# Patient Record
Sex: Female | Born: 1965 | Race: White | Hispanic: No | Marital: Married | Smoking: Current every day smoker
Health system: Southern US, Community
[De-identification: ages and names within clinical notes are randomized; demographics above are authoritative.]

## PROBLEM LIST (undated history)

## (undated) DIAGNOSIS — M069 Rheumatoid arthritis, unspecified: Secondary | ICD-10-CM

---

## 1998-06-06 ENCOUNTER — Encounter (HOSPITAL_COMMUNITY): Admission: RE | Admit: 1998-06-06 | Discharge: 1998-06-12 | Payer: Self-pay | Admitting: Obstetrics and Gynecology

## 1998-06-11 ENCOUNTER — Inpatient Hospital Stay (HOSPITAL_COMMUNITY): Admission: AD | Admit: 1998-06-11 | Discharge: 1998-06-11 | Payer: Self-pay | Admitting: Obstetrics and Gynecology

## 1998-06-12 ENCOUNTER — Inpatient Hospital Stay (HOSPITAL_COMMUNITY): Admission: AD | Admit: 1998-06-12 | Discharge: 1998-06-14 | Payer: Self-pay | Admitting: Obstetrics and Gynecology

## 2000-05-24 ENCOUNTER — Other Ambulatory Visit: Admission: RE | Admit: 2000-05-24 | Discharge: 2000-05-24 | Payer: Self-pay | Admitting: Obstetrics and Gynecology

## 2000-06-22 ENCOUNTER — Encounter: Payer: Self-pay | Admitting: Obstetrics and Gynecology

## 2000-06-22 ENCOUNTER — Ambulatory Visit (HOSPITAL_COMMUNITY): Admission: RE | Admit: 2000-06-22 | Discharge: 2000-06-22 | Payer: Self-pay | Admitting: Obstetrics and Gynecology

## 2000-09-05 ENCOUNTER — Ambulatory Visit (HOSPITAL_COMMUNITY): Admission: RE | Admit: 2000-09-05 | Discharge: 2000-09-05 | Payer: Self-pay | Admitting: Obstetrics and Gynecology

## 2000-10-31 ENCOUNTER — Inpatient Hospital Stay (HOSPITAL_COMMUNITY): Admission: AD | Admit: 2000-10-31 | Discharge: 2000-10-31 | Payer: Self-pay | Admitting: Obstetrics and Gynecology

## 2000-11-25 ENCOUNTER — Encounter (INDEPENDENT_AMBULATORY_CARE_PROVIDER_SITE_OTHER): Payer: Self-pay

## 2000-11-25 ENCOUNTER — Inpatient Hospital Stay (HOSPITAL_COMMUNITY): Admission: AD | Admit: 2000-11-25 | Discharge: 2000-11-28 | Payer: Self-pay | Admitting: Obstetrics and Gynecology

## 2000-11-30 ENCOUNTER — Encounter: Admission: RE | Admit: 2000-11-30 | Discharge: 2001-02-28 | Payer: Self-pay | Admitting: Obstetrics and Gynecology

## 2001-09-24 ENCOUNTER — Emergency Department (HOSPITAL_COMMUNITY): Admission: EM | Admit: 2001-09-24 | Discharge: 2001-09-24 | Payer: Self-pay | Admitting: *Deleted

## 2001-09-24 ENCOUNTER — Encounter: Payer: Self-pay | Admitting: *Deleted

## 2004-07-22 ENCOUNTER — Other Ambulatory Visit: Admission: RE | Admit: 2004-07-22 | Discharge: 2004-07-22 | Payer: Self-pay | Admitting: Family Medicine

## 2005-09-14 ENCOUNTER — Ambulatory Visit (HOSPITAL_COMMUNITY): Admission: RE | Admit: 2005-09-14 | Discharge: 2005-09-14 | Payer: Self-pay | Admitting: Orthopedic Surgery

## 2005-09-14 ENCOUNTER — Ambulatory Visit (HOSPITAL_BASED_OUTPATIENT_CLINIC_OR_DEPARTMENT_OTHER): Admission: RE | Admit: 2005-09-14 | Discharge: 2005-09-14 | Payer: Self-pay | Admitting: Orthopedic Surgery

## 2005-09-14 ENCOUNTER — Encounter (INDEPENDENT_AMBULATORY_CARE_PROVIDER_SITE_OTHER): Payer: Self-pay | Admitting: *Deleted

## 2007-07-06 ENCOUNTER — Encounter: Admission: RE | Admit: 2007-07-06 | Discharge: 2007-07-06 | Payer: Self-pay | Admitting: Family Medicine

## 2009-01-15 ENCOUNTER — Ambulatory Visit (HOSPITAL_COMMUNITY): Admission: RE | Admit: 2009-01-15 | Discharge: 2009-01-16 | Payer: Self-pay | Admitting: Specialist

## 2010-08-27 IMAGING — CR DG LUMBAR SPINE 2-3V
2 series · 2 of 2 positions shown · non-contrast
Comparison: 07/06/2007

CLINICAL DATA: Preop

LUMBAR SPINE - 2-3 VIEW

[t l-spine a.p.]
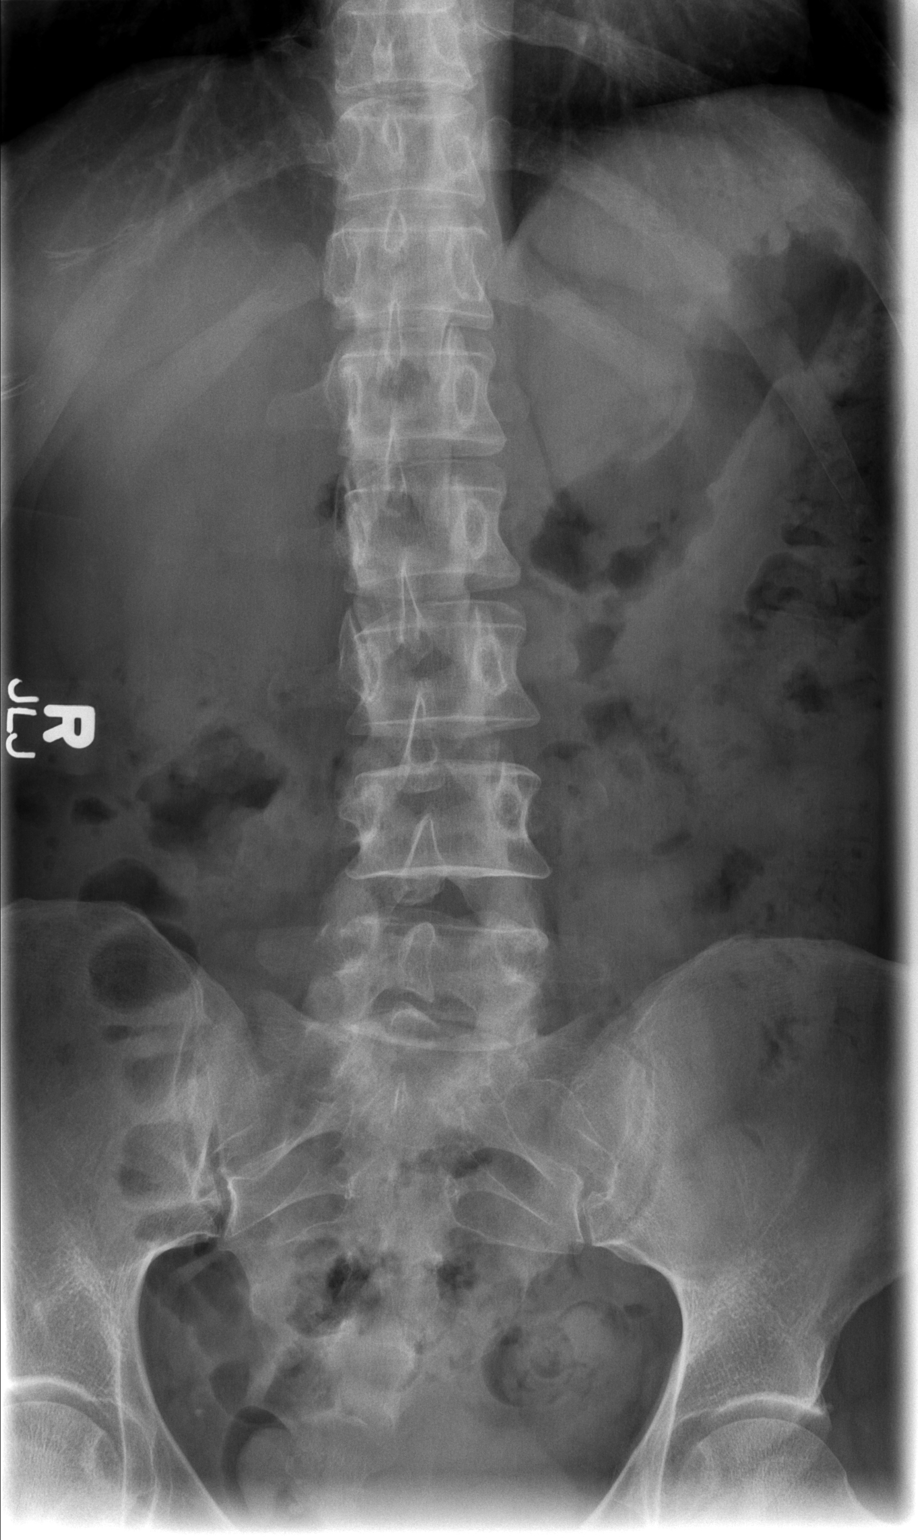

[t l-spine lat]
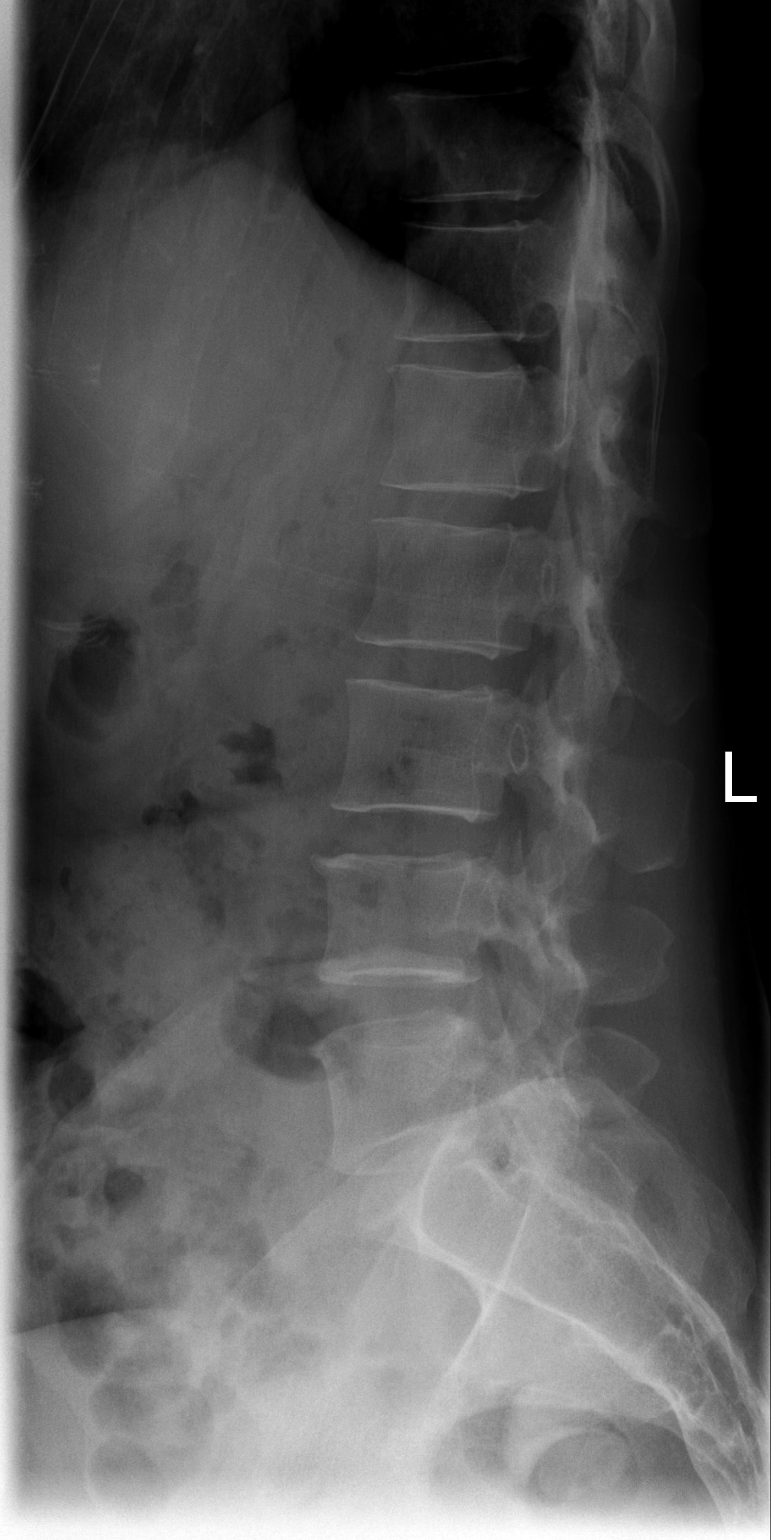

[2 of 2 positions shown; findings below may reference images not displayed]

FINDINGS: Mild levoscoliosis at L4-5.  Otherwise there is anatomic
alignment.  Mild narrowing at L4-5.  Moderate to severe narrowing
at L5-S1.  Mild facet arthropathy and [DATE] and [DATE].  No
vertebral compression deformity.  Vertebral bodies are labile for
levels.
IMPRESSION: Degenerative change at L4-5 and L5-S1.

## 2011-03-09 LAB — COMPREHENSIVE METABOLIC PANEL
ALT: 11 U/L (ref 0–35)
AST: 16 U/L (ref 0–37)
Alkaline Phosphatase: 54 U/L (ref 39–117)
CO2: 27 mEq/L (ref 19–32)
GFR calc Af Amer: >60 mL/min (ref >60)
GFR calc non Af Amer: >60 mL/min (ref >60)
Glucose, Bld: 86 mg/dL (ref 70–99)
Potassium: 4 mEq/L (ref 3.5–5.1)
Sodium: 142 mEq/L (ref 135–145)
Total Protein: 6.5 g/dL (ref 6.0–8.3)

## 2011-03-09 LAB — CBC
Hemoglobin: 13.3 g/dL (ref 12.0–15.0)
RBC: 3.86 MIL/uL — ABNORMAL LOW (ref 3.87–5.11)

## 2011-03-09 LAB — URINALYSIS, ROUTINE W REFLEX MICROSCOPIC
Glucose, UA: NEGATIVE mg/dL
Specific Gravity, Urine: 1 — ABNORMAL LOW (ref 1.005–1.030)
pH: 7 (ref 5.0–8.0)

## 2011-03-09 LAB — URINE MICROSCOPIC-ADD ON

## 2011-03-09 LAB — PROTIME-INR: Prothrombin Time: 13.7 seconds (ref 11.6–15.2)

## 2011-04-06 NOTE — Op Note (Signed)
Kathryn Farmer, Kathryn Farmer            ACCOUNT NO.:  [id number]   MEDICAL RECORD NO.:  [id number]          PATIENT TYPE:  AMB   LOCATION:  DAY                          FACILITY:  [facility]   PHYSICIAN:  Jene Every, M.D.    DATE OF BIRTH:  25-Feb-1966   DATE OF PROCEDURE:  01/15/2009  DATE OF DISCHARGE:                               OPERATIVE REPORT   PREOPERATIVE DIAGNOSIS:  HNP (herniated nucleus pulposus), spinal  stenosis L5-S1 right.   POSTOPERATIVE DIAGNOSIS:  HNP (herniated nucleus pulposus), spinal  stenosis L5-S1 right.   PROCEDURE:  Lateral recess decompression, microdiskectomy L5-S1,  retrieval free fragment L5-S1, foraminotomy S1.   ANESTHESIA:  General.   SURGEON:  Jene Every, M.D.   ASSISTANT:  Roma Schanz, P.A.   BRIEF HISTORY AND INDICATIONS:  The patient is a 45 year old with HNP,  positive neural compressive signs, diminished pedal plantar flexion,  numbness in the L5-S1 dermatomes, MRI indicating sequestered fragment  compressing the S1 nerve root refractory to conserve treatment.  She is  indicated for decompression given the presence of neurologic deficit and  a neural compressive lesion.  Risks and benefits discussed including  bleeding, infection, damage to neurovascular structures, CSF leakage,  epidural fibrosis, adjacent segment disease, need for fusion in the  future, anesthetic complications, etc.   PROCEDURE IN DETAIL:  The patient was placed in supine position.  After  adequate anesthesia and 1 gram Kefzol she was placed prone on the  [place] frame.  All bony prominences well-padded.  Lumbar region prepped  and draped in the usual sterile fashion.  Two 18 gauge spinal needles  were utilized to localize the 5/1 interspace and confirmed with x-ray.  Incision was made from the spinous process of 5 to S1.  Subcutaneous  tissue were dissected.  Electrocautery utilized to achieve hemostasis.  Dorsolumbar fascia identified and divided in line with  skin incision.  Paraspinous muscle elevated from lamina of L5 and S1.  The operating  microscope draped and brought onto the surgical field.  Confirmatory  radiograph obtained.  Hemilaminotomy of the cephalad edge of S1 was  performed with 2 mm Kerrison after detaching ligamentum flavum with a  straight curette.  Foraminotomy of S1 was performed.  Neural patty  placed beneath the ligamentum flavum to protect neural elements.  Ligamentum flavum was removed from the interspace.  Nerve root was  gently mobilized medially.  A focal HNP extruded with noted caudad to  the disk space.  This was retrieved with a nerve hook and a micro  pituitary.  Fairly large fragment was retrieved consistent with that  seen on the MRI.  An epidural venous plexus was noted and this was  cauterized.  We traced this back to the disk space.  Annular rent was  noted.  We entered the disk space and removed disk material with a  straight and upbiting pituitary to remove all herniated material.  The  rent had been enlarged with a 15 blade.  Following this was at least a  centimeter of excursion of the S1 nerve root and medial pedicle without  tension.  Hockey-stick probe passed  freely down the foramen of S1 and 5.  We checked beneath the axilla, the thecal sac, the shoulder of the root.  There was no evidence of residual disk herniation present.  Disk space  copiously irrigated with antibiotic irrigation as was the laminotomy  defect.  Inspection revealed no CSF leakage or active bleeding.  Thrombin soaked Gelfoam was placed in laminotomy defect.   Next, McCullough retractor was removed.  Paraspinous muscles inspected  and irrigated.  No evidence of active bleeding.  Dorsolumbar fascia  reapproximated with #1 Vicryl interrupted figure-of-eight sutures.  Subcutaneous tissue reapproximated with 2-0 Vicryl simple sutures.  Subcutaneous tissue was copiously irrigated as well.  Skin was  reapproximated with 4-0  subcuticular Prolene.  Wound reinforced with  Steri-Strips.  Sterile dressing applied.  The patient was placed supine  on the hospital bed and extubated without difficulty and transported to  the recovery room in satisfactory condition.  The patient tolerated the  procedure well.  There were no complications.      Jene Every, M.D.  Electronically Signed     JB/MEDQ  D:  01/15/2009  T:  01/16/2009  Job:  [id number]

## 2011-04-09 NOTE — Op Note (Signed)
[facility]  Patient:    Kathryn Farmer, Kathryn Farmer                   MRN: [id number] Proc. Date: 11/26/00 Adm. Date:  [id number] Attending:  Malon Kindle                           Operative Report  PREOPERATIVE DIAGNOSIS:       Voluntary sterilization.  POSTOPERATIVE DIAGNOSIS:      Voluntary sterilization.  OPERATION:                    Bilateral tubal ligation.  SURGEON:                      Malachi Pro. Ambrose Mantle, M.D.  ANESTHESIA:                   Epidural.  ESTIMATED BLOOD LOSS:         Less than 5 cc.  DESCRIPTION OF PROCEDURE:     The patient was brought into the operating room. After thorough counseling about the permanent effects of the tubal sterilization, she wanted to proceed. She was placed in the operating room at about 11:10 a.m. and had the epidural boosted; however, the epidural did not become effective until 11:35 a.m. At that point, after prepping and draping as a sterile field, we made a semilunar incision in the inferior portion of the umbilicus, carried it through the skin, subcutaneous tissue, and fascia, entered the peritoneal cavity, identified both tubes, confirming the identification by tracing them to the fimbriated ends. I saw the right ovary in its entirety. I did not see the left ovary; had a very small incision, I could not make much of an attempt to evaluate the rest of the pelvis. I placed a cautery hole in the mesosalpinx of both tubes, placed two ties of 0 plain catgut proximally and distally, resected the intervening portion of tube on both the right and the left; inspected the mesosalpinx for bleeding; there was no bleeding; inspected both segments of tubes to insure that I had removed the tube and both appeared to be consistent with tube. There was no bleeding. The one sponge that had been used was removed. The peritoneum and fascia were then closed with two figure-of-eight sutures of 0 Vicryl. The skin and  subcutaneous tissue were closed with interrupted 3-0 plain catgut. The patient seemed to tolerate the procedure well. Blood loss was no more than 5 cc. Sponge and needle counts were correct and she was returned to recovery in satisfactory condition. DD:  11/26/00 TD:  11/26/00 Job: [id number]

## 2011-04-09 NOTE — Discharge Summary (Signed)
[facility]  Patient:    Kathryn Farmer, Kathryn Farmer                   MRN: [id number] Adm. Date:  [id number] Disc. Date: 11/28/00 Attending:  Malon Kindle                           Discharge Summary  HISTORY OF PRESENT ILLNESS:   A 45 year old white married female para 3 0 0 3, gravida 4 with an [facility] December 02, 2000 by date and November 22, 2000 by ultrasound admitted in early labor.  Blood group and type A+ with a negative antibody, nonreactive serology, rubella immune.  Hepatitis B surface antigen negative.  HIV negative.  GC and chlamydia negative.  Triple screen normal.  One-hour Glucola 148.  Three-hour GTT 80, 162, 67, and 124. Group B Strep was positive.  The patient had a basically uneventful prenatal course.  PAST MEDICAL HISTORY:         Her past medical history is indicated in her history and physical exam.  PHYSICAL EXAMINATION:         Her vital signs on admission were normal.  The patients cervix on admission was 3 cm, 70%, vertex, and a -2.  Artificial rupture of the membranes produced a large amount of slightly meconimum-stained fluid that was thin and yellow.  HOSPITAL COURSE:              The patient was placed on IV penicillin.  She received an epidural, began spontaneous labor, and by 12:33 a.m. the cervix was 8-9 cm and 90% effaced, vertex in an ROA position, and a 0 station.  The patient progressed to full dilatation and delivered spontaneously ROA over an intact perineum by Dr. Ambrose Mantle a living female infant, 8 pounds 1 ounce, Apars of 8 at one and 9 at five minutes.  The nose and pharynx were suctioned with the DeLee with the head on the perineum but only serosanguineous fluid was obtained.  Dr. Ruben Gottron attended the baby.  The placenta was intact.  The uterus was normal.  There were no lacerations.  Blood loss was about 300 cc. The patient requested tubal ligation.  I pointed out all of the disadvantages to tubal ligation  shortly after delivery but she and her husband wanted to proceed, and on November 26, 2000 the patient underwent a tubal sterilization by Dr. Ambrose Mantle under epidural anesthesia.  Postoperatively, the patient did well and on the second postpartum day she was ready for discharge.  White count was 10,000 on admission, hemoglobin 13.3, hematocrit 38.9, platelet count 113,000. Followup hemoglobin 10.2, hematocrit 29.5, platelet count 102,000.  RPR nonreactive.  The platelet count was slightly low at her prenatal visit.  It was repeated. It was low-normal, so she either has a slightly low platelet count or has gestational thrombocytopenia.  FINAL DIAGNOSES:              1. Intrauterine pregnancy at 40+ weeks.                                  Delivered right occipitoanterior                                  position.  2. Voluntary sterilization.  OPERATION:                    1. Spontaneous delivery, right occipitoanterior                                  position.                               2. Bilateral tubal ligation.  CONDITION ON DISCHARGE:       Improved.  DISCHARGE INSTRUCTIONS:       Discharge instructions include our regular discharge instruction booklet.  FOLLOW-UP:                    The patient is to return in two weeks for followup examination.  DISCHARGE MEDICATIONS:        Tylenol #3, sixteen tablets, 1 every 4-6 hours is given at discharge. DD:  11/28/00 TD:  11/28/00 Job: [id number]

## 2011-04-09 NOTE — H&P (Signed)
[facility]  Patient:    Kathryn Farmer, Kathryn Farmer                   MRN: [id number] Adm. Date:  [id number] Disc. Date: [id number] Attending:  Michaele Offer                         History and Physical  CHIEF COMPLAINT:              The patient is a 45 year old married white female, para 44, 0-0-3, gravida 4, with an estimated date of confinement of December 02, 2000 by dates and November 22, 2000 by ultrasound, admitted in early labor.  HISTORY OF PRESENT ILLNESS:   Blood group and type are A-positive.  Negative antibody.  Nonreactive serology.  Rubella immune.  Hepatitis B surface antigen negative.  HIV negative.  GC and Chlamydia negative.  Triple screen normal. One hour glucola 148.  Three hour GTT results were 81, 62, 67, and 124.  Group B streptococci positive.  The patient had an uncomplicated prenatal course. She was offered amniocentesis but declined.  She had an initial slightly low platelet count that rose at 28 weeks to 172,000.  The patients cervix was 3 cm dilated on November 23, 2000.  She contracted all day on the day of admission and came here for evaluation.  ALLERGIES:                    No known drug allergies.  PAST SURGICAL HISTORY:        None.  PAST MEDICAL HISTORY:         Usual childhood diseases.  SOCIAL HISTORY:               No alcohol or drug use.  She smokes five to ten cigarettes a day.  PAST OBSTETRICAL HISTORY:     Vaginal delivery in May 1984, October 1995, and July 1999.  FAMILY HISTORY:               Mother with high blood pressure.  Father died from prostate cancer.  PHYSICAL EXAMINATION:  VITAL SIGNS:                  Admission vital signs were normal.  HEART/LUNGS:                  On her prenatal visits her heart and lungs were normal.  ABDOMEN:                      Soft.  Fundal height 42 cm on November 23, 2000. Fetal heart tones were normal.  Contractions every four to nine minutes.  PELVIC:                        Cervix 3 cm dilated, 70% effaced, vertex at -2 station.  Artificial rupture of membranes produced a very large amount of slightly meconium stained fluid that was thin and yellow.                                The patient was placed on IV penicillin and she received an epidural and began spontaneous labor.  She progressed to full dilatation and delivered spontaneously ROA over an intact perineum a viable female infant.  The infant had good Apgar scores, assigned by Dr.  Angelita Ingles (I do not have the exact numbers with me).  Dr. Katrinka Blazing attended the infant.  The nose and pharynx of the infant were suctioned with the head on the perineum but only serosanguineous fluid was obtained.  Dr. Katrinka Blazing did not intubate the baby.  The placenta was removed intact.  There were no lacerations.  Estimated blood loss was 300 cc.  The patient requested tubal sterilization.  She has been counseled about the disadvantages of proceeding with tubal sterilization shortly after delivery and she and her husband fully understand the disadvantages and want to proceed.  She is therefore scheduled for tubal ligation.  IMPRESSION:                   1. Intrauterine pregnancy at term, delivered                                  from right occipitoanterior position.                               2. Voluntary sterilization. DD:  11/26/00 TD:  11/26/00 Job: [id number]

## 2011-04-09 NOTE — Op Note (Signed)
NAMEINEZE, SERRAO            ACCOUNT NO.:  [id number]   MEDICAL RECORD NO.:  [id number]          PATIENT TYPE:  AMB   LOCATION:  DSC                          FACILITY:  [facility]   PHYSICIAN:  Katy Fitch. Sypher, M.D. DATE OF BIRTH:  22-Oct-1966   DATE OF PROCEDURE:  09/14/2005  DATE OF DISCHARGE:                                 OPERATIVE REPORT   PREOPERATIVE DIAGNOSIS:  Rheumatoid arthritis with mass right index finger  metacarpal neck and metacarpal phalangeal joint.   POSTOPERATIVE DIAGNOSIS:  Synovitis of the metacarpal phalangeal joint with  large dorsal synovial mass deep to the extensor mechanism.   OPERATION:  Synovectomy of right index finger metacarpal phalangeal joint  and excision of dorsal synovial mass for biopsy.   SURGEON:  Katy Fitch. Sypher, M.D.   ASSISTANT:  Marveen Reeks. Dasnoit, P.A.-C.   ANESTHESIA:  General by LMA.   ANESTHESIOLOGIST:  Janetta Hora. Gelene Mink, M.D.   INDICATIONS FOR PROCEDURE:  Dean Wonder is a 45 year old woman referred  by Dr. Syliva Overman for evaluation and management of a mass on the dorsal  side of her right hand.  She has had rheumatoid arthritis for a lengthy  period of time managed with multiple medications.  She has developed masses  on the dorsal aspect of her left wrist consistent with synovitis exiting  from the distal radial ulnar joint and dorsal compartment and a mass on the  dorsal aspect of her right hand.  Plain films were nondiagnostic.  We  recommended excisional biopsy of her mass for diagnosis and hopeful  resolution.  After informed consent, she is brought to the operating room at  this time.   PROCEDURE:  Maat Kafer is brought to the operating room and placed in  supine position on the operating table.  Following induction of general  anesthesia by LMA technique, the right arm was prepped with Betadine  solution and sterilely draped.  A pneumatic tourniquet was applied to the  proximal brachium.  Following  exsanguination of the limb with Esmarch  bandage, arterial tourniquet on the proximal brachium was inflated to 220  mmHg.  The procedure commenced with a curvilinear incision exposing the mass  and the MP joint capsule.  Care was taken to preserve the extensor mechanism  including the radial and ulnar sagittal fibers as well as the extensor  indices proprius and common extensor to the index finger.  The mass was  circumferentially dissected and found to be a large rheumatoid nodule that  had a necrotic center.  A complete synovectomy of the dorsal aspect of the  MP joint was accomplished.  After hemostasis was achieved, the MP joint was  infiltrated with a mixture of 2% lidocaine  and Depo-Medrol 40 mg per mL, a total of 1 mL of the mixture.  The wound was  then irrigated and repaired with intradermal 3-0 Prolene.  A compressive  dressing was applied with Xeroform, sterile gauze, and Ace wrap.  There were  no apparent complications.      Katy Fitch Sypher, M.D.  Electronically Signed     RVS/MEDQ  D:  09/14/2005  T:  09/14/2005  Job:  [id number]

## 2011-04-29 ENCOUNTER — Other Ambulatory Visit: Payer: Self-pay | Admitting: Obstetrics & Gynecology

## 2011-04-29 DIAGNOSIS — Z1231 Encounter for screening mammogram for malignant neoplasm of breast: Secondary | ICD-10-CM

## 2011-05-05 ENCOUNTER — Ambulatory Visit: Payer: Self-pay

## 2011-05-07 ENCOUNTER — Ambulatory Visit: Payer: Self-pay

## 2011-05-31 ENCOUNTER — Ambulatory Visit
Admission: RE | Admit: 2011-05-31 | Discharge: 2011-05-31 | Disposition: A | Discharge status: Home / Self Care | Payer: BC Managed Care – PPO | Source: Ambulatory Visit | Attending: Obstetrics & Gynecology | Admitting: Obstetrics & Gynecology

## 2011-05-31 DIAGNOSIS — Z1231 Encounter for screening mammogram for malignant neoplasm of breast: Secondary | ICD-10-CM

## 2011-06-03 ENCOUNTER — Other Ambulatory Visit: Payer: Self-pay | Admitting: Obstetrics & Gynecology

## 2011-06-03 DIAGNOSIS — R928 Other abnormal and inconclusive findings on diagnostic imaging of breast: Secondary | ICD-10-CM

## 2011-07-09 ENCOUNTER — Other Ambulatory Visit: Payer: Self-pay | Admitting: Obstetrics & Gynecology

## 2011-07-09 DIAGNOSIS — R928 Other abnormal and inconclusive findings on diagnostic imaging of breast: Secondary | ICD-10-CM

## 2011-07-12 ENCOUNTER — Ambulatory Visit
Admission: RE | Admit: 2011-07-12 | Discharge: 2011-07-12 | Disposition: A | Discharge status: Home / Self Care | Payer: BC Managed Care – PPO | Source: Ambulatory Visit | Attending: Obstetrics & Gynecology | Admitting: Obstetrics & Gynecology

## 2011-07-12 DIAGNOSIS — R928 Other abnormal and inconclusive findings on diagnostic imaging of breast: Secondary | ICD-10-CM

## 2013-10-31 ENCOUNTER — Encounter (HOSPITAL_COMMUNITY): Payer: Self-pay | Admitting: Emergency Medicine

## 2013-10-31 ENCOUNTER — Emergency Department (HOSPITAL_COMMUNITY)
Admission: EM | Admit: 2013-10-31 | Discharge: 2013-10-31 | Disposition: A | Discharge status: Home / Self Care | Payer: BC Managed Care – PPO | Source: Home / Self Care | Attending: Emergency Medicine | Admitting: Emergency Medicine

## 2013-10-31 DIAGNOSIS — J039 Acute tonsillitis, unspecified: Secondary | ICD-10-CM

## 2013-10-31 MED ORDER — PREDNISONE 20 MG PO TABS: 20.0000 mg | ORAL_TABLET | Freq: Two times a day (BID) | ORAL | Status: AC

## 2013-10-31 MED ORDER — AMOXICILLIN 500 MG PO CAPS: 500.0000 mg | ORAL_CAPSULE | Freq: Three times a day (TID) | ORAL | Status: DC

## 2013-10-31 NOTE — ED Provider Notes (Signed)
Chief Complaint:   Chief Complaint  Patient presents with  . Lymphadenopathy    History of Present Illness:   Kathryn Farmer is a 47 year old female who has a one-week history of pain, sensation of swelling, an enlarged, tender lymph node in the left side of the throat and neck. Her symptoms began immediately after she had some dental work done on that side. She had a tooth drilled and filled. It hurts to swallow. She's felt somewhat chilled but not had any fever. She denies any headache, nasal congestion, rhinorrhea, or cough. She has not been exposed to strep or to mono.  Review of Systems:  Other than as noted above, the patient denies any of the following symptoms. Systemic:  No fever, chills, sweats, myalgias, or headache. Eye:  No redness, pain or drainage. ENT:  No earache, nasal congestion, sneezing, rhinorrhea, sinus pressure, sinus pain, or post nasal drip. Lungs:  No cough, sputum production, wheezing, shortness of breath, or chest pain. GI:  No abdominal pain, nausea, vomiting, or diarrhea. Skin:  No rash.  PMFSH:  Past medical history, family history, social history, meds, allergies, and nurse's notes were reviewed.  There is no known exposure to strep or mono.  No prior history of step or mono.  The patient denies use of tobacco. She has a history of rheumatoid arthritis which is in remission.  Physical Exam:   Vital signs:  BP 143/88  Pulse 89  Temp(Src) 98.4 F (36.9 C) (Oral)  Resp 20  SpO2 99% General:  Alert, in no distress. Phonation was normal, no drooling, and patient was able to handle secretions well.  Eye:  No conjunctival injection or drainage. Lids were normal. ENT:  TMs and canals were normal, without erythema or inflammation.  Nasal mucosa was clear and uncongested, without drainage.  Mucous membranes were moist.  Exam of pharynx reveals the tonsils to be mildly enlarged with slight erythema and a few spots of white exudate. There also appears to be a tiny,  red, ulceration on the tonsil.  There were no oral ulcerations or lesions. There was no bulging of the tonsillar pillars, and the uvula was midline. Neck:  Supple, no adenopathy, tenderness or mass. Lungs:  No respiratory distress.  Lungs were clear to auscultation, without wheezes, rales or rhonchi.  Breath sounds were clear and equal bilaterally.  Heart:  Regular rhythm, without gallops, murmers or rubs. Skin:  Clear, warm, and dry, without rash or lesions.  Labs:   Results for orders placed during the hospital encounter of 10/31/13  POCT RAPID STREP A ([facility]RG CARE ONLY)      Result Value Range   Streptococcus, Group A Screen (Direct) NEGATIVE  NEGATIVE   Assessment:  The encounter diagnosis was Tonsillitis.  There is no evidence of a peritonsillar abscess.  This could be viral or bacterial. I do not think of anything to do with the dental work.  Plan:   1.  Meds:  The following meds were prescribed:   New Prescriptions   AMOXICILLIN (AMOXIL) 500 MG CAPSULE    Take 1 capsule (500 mg total) by mouth 3 (three) times daily.   PREDNISONE (DELTASONE) 20 MG TABLET    Take 1 tablet (20 mg total) by mouth 2 (two) times daily.    2.  Patient Education/Counseling:  The patient was given appropriate handouts, self care instructions, and instructed in symptomatic relief, including hot saline gargles, throat lozenges, infectious precautions, and need to trade out toothbrush.  3.  Follow up:  The patient was told to follow up if no better in 3 to 4 days, or sooner if becoming worse in any way, and given some red flag symptoms such as difficulty swallowing or breathing which would prompt immediate return.  Follow up here if no better in 3 or 4 days.    Reuben Likes, MD 10/31/13 [phone]

## 2013-10-31 NOTE — ED Notes (Signed)
Pt  Reports  Pain  l  Side  Neck  With   Swelling  Of  The       Lymph  Nodes        Symptoms  X  2  Days          Had some  Dental  Work  That  Side   Recently  As  Well   She  Is  Sitting upright on  Exam table  Speaking  In  Complete  sentances   And  Is  In no  Distress

## 2013-11-02 LAB — CULTURE, GROUP A STREP

## 2013-11-07 NOTE — ED Notes (Signed)
Spoke w spouse , who reports she is experiencing worsening pain swelling much worse. Advised she needs exam TODAY in PCP office or [facility]

## 2016-04-29 ENCOUNTER — Other Ambulatory Visit: Payer: Self-pay | Admitting: Orthopedic Surgery

## 2016-04-29 DIAGNOSIS — M08032 Unspecified juvenile rheumatoid arthritis, left wrist: Secondary | ICD-10-CM

## 2016-04-29 DIAGNOSIS — R2232 Localized swelling, mass and lump, left upper limb: Secondary | ICD-10-CM

## 2016-05-05 ENCOUNTER — Other Ambulatory Visit: Payer: Self-pay

## 2022-09-22 DIAGNOSIS — M542 Cervicalgia: Secondary | ICD-10-CM | POA: Diagnosis not present

## 2022-09-22 DIAGNOSIS — Z6823 Body mass index (BMI) 23.0-23.9, adult: Secondary | ICD-10-CM | POA: Diagnosis not present

## 2022-09-22 DIAGNOSIS — R5383 Other fatigue: Secondary | ICD-10-CM | POA: Diagnosis not present

## 2022-09-22 DIAGNOSIS — M79641 Pain in right hand: Secondary | ICD-10-CM | POA: Diagnosis not present

## 2022-09-22 DIAGNOSIS — M0579 Rheumatoid arthritis with rheumatoid factor of multiple sites without organ or systems involvement: Secondary | ICD-10-CM | POA: Diagnosis not present

## 2022-09-22 DIAGNOSIS — M79642 Pain in left hand: Secondary | ICD-10-CM | POA: Diagnosis not present

## 2022-10-15 ENCOUNTER — Encounter (HOSPITAL_COMMUNITY): Payer: Self-pay | Admitting: *Deleted

## 2022-10-15 ENCOUNTER — Ambulatory Visit (HOSPITAL_COMMUNITY)
Admission: EM | Admit: 2022-10-15 | Discharge: 2022-10-15 | Disposition: A | Discharge status: Home / Self Care | Payer: PPO | Attending: Internal Medicine | Admitting: Internal Medicine

## 2022-10-15 DIAGNOSIS — N3000 Acute cystitis without hematuria: Secondary | ICD-10-CM | POA: Diagnosis not present

## 2022-10-15 DIAGNOSIS — R300 Dysuria: Secondary | ICD-10-CM

## 2022-10-15 HISTORY — DX: Rheumatoid arthritis, unspecified: M06.9

## 2022-10-15 LAB — POCT URINALYSIS DIPSTICK, ED / UC
Bilirubin Urine: NEGATIVE
Glucose, UA: NEGATIVE mg/dL
Ketones, ur: NEGATIVE mg/dL
Nitrite: NEGATIVE
Protein, ur: 30 mg/dL — AB
Specific Gravity, Urine: 1.01 (ref 1.005–1.030)
Urobilinogen, UA: 0.2 mg/dL (ref 0.0–1.0)
pH: 6 (ref 5.0–8.0)

## 2022-10-15 MED ORDER — CEPHALEXIN 500 MG PO CAPS: 500.0000 mg | ORAL_CAPSULE | Freq: Two times a day (BID) | ORAL | 0 refills | Status: AC

## 2022-10-15 NOTE — Discharge Instructions (Signed)
You have been diagnosed with urinary tract infection today.  I have prescribed Keflex for you to take 500 mg twice daily for the next 7 days.  If you develop diarrhea while taking this medication you may purchase an over-the-counter probiotic or eat yogurt with live active cultures.  To avoid GI upset please take this medication with food. I have sent your urine for culture to see what type of bacteria grows. We will call you if we need to change the treatment plan based on the results of your urine culture.  If you develop any new or worsening symptoms or do not improve in the next 2 to 3 days, please return.  If your symptoms are severe, please go to the emergency room.  Follow-up with your primary care provider for further evaluation and management of your symptoms as well as ongoing wellness visits.  I hope you feel better! 

## 2022-10-15 NOTE — ED Triage Notes (Signed)
Pt states that she woke up this morning with some urinary pressure and burning. No other sx she called a nurse line and they advised a visit since she takes methotrexate.

## 2022-10-17 LAB — URINE CULTURE: Culture: >=100000 — AB

## 2022-10-17 NOTE — ED Provider Notes (Signed)
[facility]    CSN: [id number] Arrival date & time: 10/15/22  1834      History   Chief Complaint Chief Complaint  Patient presents with   Dysuria    HPI Kathryn Farmer is a 56 y.o. female.   Patient presents to urgent care for evaluation of bladder pressure and dysuria that started this morning. Over the last few days, she has experienced some low back and suprapubic discomfort. No recent antibiotic use, denies recurrent UTIs. Denies fever/chills, nausea, vomiting, dizziness, URI symptoms, vaginal symptoms, and lightheadedness. No flank pain, urinary urgency, hesitancy, or incomplete bladder emptying. No attempted use of any over the counter medications prior to arrival at urgent care. She takes methotrexate for rheumatoid arthritis and started this medication 3 weeks ago. Called the nurse line at her PCP and they recommended her come to urgent care due to immunocompromised state.    Dysuria   Past Medical History:  Diagnosis Date   RA (rheumatoid arthritis) (HCC)     There are no problems to display for this patient.   History reviewed. No pertinent surgical history.  OB History   No obstetric history on file.      Home Medications    Prior to Admission medications   Medication Sig Start Date End Date Taking? Authorizing Provider  cephALEXin (KEFLEX) 500 MG capsule Take 1 capsule (500 mg total) by mouth 2 (two) times daily for 7 days. 10/15/22 10/22/22 Yes StanhopeDonavan Burnet, FNP  folic acid (FOLVITE) 1 MG tablet 1 tablet Orally Once a day for 30 days 09/27/22  Yes [provider]  meloxicam (MOBIC) 15 MG tablet 1 tablet Orally Once a day for 90 days 09/29/18  Yes [provider]  methotrexate (RHEUMATREX) 2.5 MG tablet TAKE 4 TABLETS ALL AT THE SAME TIME Orally once a week for 30 days 09/27/22  Yes [provider]  sulfaSALAzine (AZULFIDINE) 500 MG tablet 1 tablet Orally Once a day for 30 day(s) 11/05/21  Yes [provider]  predniSONE (DELTASONE) 20 MG tablet Take 1 tablet (20 mg total) by mouth 2 (two) times daily. 10/31/13   Reuben Likes, MD    Family History History reviewed. No pertinent family history.  Social History Social History   Tobacco Use   Smoking status: Every Day    Packs/day: 0.50    Types: Cigarettes  Vaping Use   Vaping Use: Never used  Substance Use Topics   Alcohol use: Yes    Alcohol/week: 1.0 standard drink of alcohol    Types: 1 Glasses of wine per week   Drug use: Never     Allergies   Patient has no known allergies.   Review of Systems Review of Systems  Genitourinary:  Positive for dysuria.  Per HPI   Physical Exam Triage Vital Signs ED Triage Vitals  Enc Vitals Group     BP 10/15/22 1904 (!) 164/95     Pulse Rate 10/15/22 1904 87     Resp 10/15/22 1904 18     Temp 10/15/22 1904 97.6 F (36.4 C)     Temp Source 10/15/22 1904 Oral     SpO2 10/15/22 1904 99 %     Weight --      Height --      Head Circumference --      Peak Flow --      Pain Score 10/15/22 1902 0     Pain Loc --      Pain  Edu? --      Excl. in [place] ? --    No data found.  Updated Vital Signs BP (!) 164/95 (BP Location: Right Arm)   Pulse 87   Temp 97.6 F (36.4 C) (Oral)   Resp 18   SpO2 99%   Visual Acuity Right Eye Distance:   Left Eye Distance:   Bilateral Distance:    Right Eye Near:   Left Eye Near:    Bilateral Near:     Physical Exam Vitals and nursing note reviewed.  Constitutional:      Appearance: She is not ill-appearing or toxic-appearing.  HENT:     Head: Normocephalic and atraumatic.     Right Ear: Hearing and external ear normal.     Left Ear: Hearing and external ear normal.     Nose: Nose normal.     Mouth/Throat:     Lips: Pink.     Mouth: Mucous membranes are moist.     Pharynx: No posterior oropharyngeal erythema.  Eyes:     General: Lids are normal. Vision grossly intact. Gaze aligned appropriately.     Extraocular  Movements: Extraocular movements intact.     Conjunctiva/sclera: Conjunctivae normal.  Pulmonary:     Effort: Pulmonary effort is normal.  Abdominal:     General: Abdomen is flat. Bowel sounds are normal.     Tenderness: There is no abdominal tenderness. There is no right CVA tenderness or left CVA tenderness.  Musculoskeletal:     Cervical back: Neck supple.  Skin:    General: Skin is warm and dry.     Capillary Refill: Capillary refill takes less than 2 seconds.     Findings: No rash.  Neurological:     General: No focal deficit present.     Mental Status: She is alert and oriented to person, place, and time. Mental status is at baseline.     Cranial Nerves: No dysarthria or facial asymmetry.  Psychiatric:        Mood and Affect: Mood normal.        Speech: Speech normal.        Behavior: Behavior normal.        Thought Content: Thought content normal.        Judgment: Judgment normal.      UC Treatments / Results  Labs (all labs ordered are listed, but only abnormal results are displayed) Labs Reviewed  URINE CULTURE - Abnormal; Notable for the following components:      Result Value   Culture >=100,000 COLONIES/mL ESCHERICHIA COLI (*)    Organism ID, Bacteria ESCHERICHIA COLI (*)    All other components within normal limits  POCT URINALYSIS DIPSTICK, ED / UC - Abnormal; Notable for the following components:   Hgb urine dipstick LARGE (*)    Protein, ur 30 (*)    Leukocytes,Ua LARGE (*)    All other components within normal limits    EKG   Radiology No results found.  Procedures Procedures (including critical care time)  Medications Ordered in UC Medications - No data to display  Initial Impression / Assessment and Plan / UC Course  I have reviewed the triage vital signs and the nursing notes.  Pertinent labs & imaging results that were available during my care of the patient were reviewed by me and considered in my medical decision making (see chart for  details).   1. Dysuria, Acute Cystitis without hematuria Urinalysis consistent with probable urinary tract infection. Urine culture pending.  Will start keflex twice daily for the next 7 days. Denies antibiotic allergies. May take tylenol as needed for low back/suprapubic discomfort as needed. Avoid urinary irritants, push fluids to stay well hydrated. Staff will call patient if we need to change antibiotic based on urine culture results. She is agreeable with this plan.   Discussed physical exam and available lab work findings in clinic with patient.  Counseled patient regarding appropriate use of medications and potential side effects for all medications recommended or prescribed today. Discussed red flag signs and symptoms of worsening condition,when to call the PCP office, return to urgent care, and when to seek higher level of care in the emergency department. Patient verbalizes understanding and agreement with plan. All questions answered. Patient discharged in stable condition.    Final Clinical Impressions(s) / UC Diagnoses   Final diagnoses:  Dysuria  Acute cystitis without hematuria     Discharge Instructions      You have been diagnosed with urinary tract infection today.  I have prescribed Keflex for you to take 500 mg twice daily for the next 7 days.  If you develop diarrhea while taking this medication you may purchase an over-the-counter probiotic or eat yogurt with live active cultures.  To avoid GI upset please take this medication with food. I have sent your urine for culture to see what type of bacteria grows. We will call you if we need to change the treatment plan based on the results of your urine culture.  If you develop any new or worsening symptoms or do not improve in the next 2 to 3 days, please return.  If your symptoms are severe, please go to the emergency room.  Follow-up with your primary care provider for further evaluation and management of your symptoms as well  as ongoing wellness visits.  I hope you feel better!    ED Prescriptions     Medication Sig Dispense Auth. Provider   cephALEXin (KEFLEX) 500 MG capsule Take 1 capsule (500 mg total) by mouth 2 (two) times daily for 7 days. 14 capsule Talbot Grumbling, FNP      PDMP not reviewed this encounter.   Talbot Grumbling, [place] 10/17/22 2057

## 2022-10-22 DIAGNOSIS — Z6823 Body mass index (BMI) 23.0-23.9, adult: Secondary | ICD-10-CM | POA: Diagnosis not present

## 2022-10-22 DIAGNOSIS — M79642 Pain in left hand: Secondary | ICD-10-CM | POA: Diagnosis not present

## 2022-10-22 DIAGNOSIS — M79641 Pain in right hand: Secondary | ICD-10-CM | POA: Diagnosis not present

## 2022-10-22 DIAGNOSIS — Z79899 Other long term (current) drug therapy: Secondary | ICD-10-CM | POA: Diagnosis not present

## 2022-10-22 DIAGNOSIS — M0579 Rheumatoid arthritis with rheumatoid factor of multiple sites without organ or systems involvement: Secondary | ICD-10-CM | POA: Diagnosis not present

## 2023-01-21 DIAGNOSIS — Z6823 Body mass index (BMI) 23.0-23.9, adult: Secondary | ICD-10-CM | POA: Diagnosis not present

## 2023-01-21 DIAGNOSIS — M25562 Pain in left knee: Secondary | ICD-10-CM | POA: Diagnosis not present

## 2023-01-21 DIAGNOSIS — Z79899 Other long term (current) drug therapy: Secondary | ICD-10-CM | POA: Diagnosis not present

## 2023-01-21 DIAGNOSIS — M79642 Pain in left hand: Secondary | ICD-10-CM | POA: Diagnosis not present

## 2023-01-21 DIAGNOSIS — M79641 Pain in right hand: Secondary | ICD-10-CM | POA: Diagnosis not present

## 2023-01-21 DIAGNOSIS — M0579 Rheumatoid arthritis with rheumatoid factor of multiple sites without organ or systems involvement: Secondary | ICD-10-CM | POA: Diagnosis not present

## 2023-02-17 DIAGNOSIS — M0579 Rheumatoid arthritis with rheumatoid factor of multiple sites without organ or systems involvement: Secondary | ICD-10-CM | POA: Diagnosis not present

## 2023-04-29 DIAGNOSIS — M79641 Pain in right hand: Secondary | ICD-10-CM | POA: Diagnosis not present

## 2023-04-29 DIAGNOSIS — Z6822 Body mass index (BMI) 22.0-22.9, adult: Secondary | ICD-10-CM | POA: Diagnosis not present

## 2023-04-29 DIAGNOSIS — M79642 Pain in left hand: Secondary | ICD-10-CM | POA: Diagnosis not present

## 2023-04-29 DIAGNOSIS — M0579 Rheumatoid arthritis with rheumatoid factor of multiple sites without organ or systems involvement: Secondary | ICD-10-CM | POA: Diagnosis not present

## 2023-04-29 DIAGNOSIS — Z79899 Other long term (current) drug therapy: Secondary | ICD-10-CM | POA: Diagnosis not present

## 2023-04-29 DIAGNOSIS — M25562 Pain in left knee: Secondary | ICD-10-CM | POA: Diagnosis not present

## 2023-06-03 DIAGNOSIS — Z79899 Other long term (current) drug therapy: Secondary | ICD-10-CM | POA: Diagnosis not present

## 2023-06-03 DIAGNOSIS — M0579 Rheumatoid arthritis with rheumatoid factor of multiple sites without organ or systems involvement: Secondary | ICD-10-CM | POA: Diagnosis not present

## 2023-08-04 DIAGNOSIS — Z79899 Other long term (current) drug therapy: Secondary | ICD-10-CM | POA: Diagnosis not present

## 2023-08-04 DIAGNOSIS — M0579 Rheumatoid arthritis with rheumatoid factor of multiple sites without organ or systems involvement: Secondary | ICD-10-CM | POA: Diagnosis not present

## 2023-08-04 DIAGNOSIS — M79641 Pain in right hand: Secondary | ICD-10-CM | POA: Diagnosis not present

## 2023-08-04 DIAGNOSIS — M79642 Pain in left hand: Secondary | ICD-10-CM | POA: Diagnosis not present

## 2023-08-04 DIAGNOSIS — Z6821 Body mass index (BMI) 21.0-21.9, adult: Secondary | ICD-10-CM | POA: Diagnosis not present

## 2023-09-02 DIAGNOSIS — R945 Abnormal results of liver function studies: Secondary | ICD-10-CM | POA: Diagnosis not present

## 2023-11-10 DIAGNOSIS — R634 Abnormal weight loss: Secondary | ICD-10-CM | POA: Diagnosis not present

## 2023-11-10 DIAGNOSIS — M79641 Pain in right hand: Secondary | ICD-10-CM | POA: Diagnosis not present

## 2023-11-10 DIAGNOSIS — M0579 Rheumatoid arthritis with rheumatoid factor of multiple sites without organ or systems involvement: Secondary | ICD-10-CM | POA: Diagnosis not present

## 2023-11-10 DIAGNOSIS — Z79899 Other long term (current) drug therapy: Secondary | ICD-10-CM | POA: Diagnosis not present

## 2023-11-10 DIAGNOSIS — Z6820 Body mass index (BMI) 20.0-20.9, adult: Secondary | ICD-10-CM | POA: Diagnosis not present

## 2023-11-10 DIAGNOSIS — M79642 Pain in left hand: Secondary | ICD-10-CM | POA: Diagnosis not present

## 2024-02-16 DIAGNOSIS — M79641 Pain in right hand: Secondary | ICD-10-CM | POA: Diagnosis not present

## 2024-02-16 DIAGNOSIS — Z6820 Body mass index (BMI) 20.0-20.9, adult: Secondary | ICD-10-CM | POA: Diagnosis not present

## 2024-02-16 DIAGNOSIS — M0579 Rheumatoid arthritis with rheumatoid factor of multiple sites without organ or systems involvement: Secondary | ICD-10-CM | POA: Diagnosis not present

## 2024-02-16 DIAGNOSIS — M79642 Pain in left hand: Secondary | ICD-10-CM | POA: Diagnosis not present

## 2024-02-16 DIAGNOSIS — R5383 Other fatigue: Secondary | ICD-10-CM | POA: Diagnosis not present

## 2024-02-16 DIAGNOSIS — Z79899 Other long term (current) drug therapy: Secondary | ICD-10-CM | POA: Diagnosis not present

## 2024-03-16 ENCOUNTER — Other Ambulatory Visit (HOSPITAL_COMMUNITY)
Admission: RE | Admit: 2024-03-16 | Discharge: 2024-03-16 | Disposition: A | Discharge status: Home / Self Care | Source: Ambulatory Visit | Attending: Family Medicine | Admitting: Family Medicine

## 2024-03-16 ENCOUNTER — Other Ambulatory Visit: Payer: Self-pay | Admitting: Family Medicine

## 2024-03-16 DIAGNOSIS — Z1211 Encounter for screening for malignant neoplasm of colon: Secondary | ICD-10-CM | POA: Diagnosis not present

## 2024-03-16 DIAGNOSIS — Z79899 Other long term (current) drug therapy: Secondary | ICD-10-CM | POA: Diagnosis not present

## 2024-03-16 DIAGNOSIS — Z0000 Encounter for general adult medical examination without abnormal findings: Secondary | ICD-10-CM | POA: Diagnosis not present

## 2024-03-16 DIAGNOSIS — Z01411 Encounter for gynecological examination (general) (routine) with abnormal findings: Secondary | ICD-10-CM | POA: Insufficient documentation

## 2024-03-16 DIAGNOSIS — Z124 Encounter for screening for malignant neoplasm of cervix: Secondary | ICD-10-CM | POA: Diagnosis not present

## 2024-03-16 DIAGNOSIS — Z136 Encounter for screening for cardiovascular disorders: Secondary | ICD-10-CM | POA: Diagnosis not present

## 2024-03-16 DIAGNOSIS — F17210 Nicotine dependence, cigarettes, uncomplicated: Secondary | ICD-10-CM | POA: Diagnosis not present

## 2024-03-16 DIAGNOSIS — Z1151 Encounter for screening for human papillomavirus (HPV): Secondary | ICD-10-CM | POA: Insufficient documentation

## 2024-03-16 DIAGNOSIS — R945 Abnormal results of liver function studies: Secondary | ICD-10-CM | POA: Diagnosis not present

## 2024-03-16 DIAGNOSIS — M069 Rheumatoid arthritis, unspecified: Secondary | ICD-10-CM | POA: Diagnosis not present

## 2024-03-16 DIAGNOSIS — Z13220 Encounter for screening for lipoid disorders: Secondary | ICD-10-CM | POA: Diagnosis not present

## 2024-03-21 LAB — CYTOLOGY - PAP
Comment: NEGATIVE
Diagnosis: NEGATIVE
Diagnosis: REACTIVE
High risk HPV: NEGATIVE

## 2024-03-22 DIAGNOSIS — R945 Abnormal results of liver function studies: Secondary | ICD-10-CM | POA: Diagnosis not present

## 2024-03-26 ENCOUNTER — Encounter: Payer: Self-pay | Admitting: Family Medicine

## 2024-03-26 ENCOUNTER — Other Ambulatory Visit: Payer: Self-pay | Admitting: Family Medicine

## 2024-03-26 DIAGNOSIS — E2839 Other primary ovarian failure: Secondary | ICD-10-CM

## 2024-04-19 DIAGNOSIS — Z1211 Encounter for screening for malignant neoplasm of colon: Secondary | ICD-10-CM | POA: Diagnosis not present

## 2024-04-19 DIAGNOSIS — K635 Polyp of colon: Secondary | ICD-10-CM | POA: Diagnosis not present

## 2024-04-23 DIAGNOSIS — K635 Polyp of colon: Secondary | ICD-10-CM | POA: Diagnosis not present

## 2024-05-18 DIAGNOSIS — Z681 Body mass index (BMI) 19 or less, adult: Secondary | ICD-10-CM | POA: Diagnosis not present

## 2024-05-18 DIAGNOSIS — M79641 Pain in right hand: Secondary | ICD-10-CM | POA: Diagnosis not present

## 2024-05-18 DIAGNOSIS — Z79899 Other long term (current) drug therapy: Secondary | ICD-10-CM | POA: Diagnosis not present

## 2024-05-18 DIAGNOSIS — M79642 Pain in left hand: Secondary | ICD-10-CM | POA: Diagnosis not present

## 2024-05-18 DIAGNOSIS — M0579 Rheumatoid arthritis with rheumatoid factor of multiple sites without organ or systems involvement: Secondary | ICD-10-CM | POA: Diagnosis not present

## 2024-08-17 DIAGNOSIS — Z681 Body mass index (BMI) 19 or less, adult: Secondary | ICD-10-CM | POA: Diagnosis not present

## 2024-08-17 DIAGNOSIS — M79641 Pain in right hand: Secondary | ICD-10-CM | POA: Diagnosis not present

## 2024-08-17 DIAGNOSIS — M0579 Rheumatoid arthritis with rheumatoid factor of multiple sites without organ or systems involvement: Secondary | ICD-10-CM | POA: Diagnosis not present

## 2024-08-17 DIAGNOSIS — Z79899 Other long term (current) drug therapy: Secondary | ICD-10-CM | POA: Diagnosis not present

## 2024-08-17 DIAGNOSIS — M79642 Pain in left hand: Secondary | ICD-10-CM | POA: Diagnosis not present

## 2024-12-13 ENCOUNTER — Other Ambulatory Visit
